# Patient Record
Sex: Male | Born: 1965 | Race: White | Hispanic: No | Marital: Married | Smoking: Never smoker
Health system: Southern US, Community
[De-identification: ages and names within clinical notes are randomized; demographics above are authoritative.]

## PROBLEM LIST (undated history)

## (undated) DIAGNOSIS — Z8619 Personal history of other infectious and parasitic diseases: Secondary | ICD-10-CM

## (undated) HISTORY — DX: Personal history of other infectious and parasitic diseases: Z86.19

## (undated) HISTORY — PX: WISDOM TOOTH EXTRACTION: SHX21

---

## 2017-05-28 ENCOUNTER — Encounter: Payer: Self-pay | Admitting: Physician Assistant

## 2017-05-28 ENCOUNTER — Ambulatory Visit (INDEPENDENT_AMBULATORY_CARE_PROVIDER_SITE_OTHER): Payer: Managed Care, Other (non HMO) | Admitting: Physician Assistant

## 2017-05-28 VITALS — BP 116/80 | HR 54 | Temp 98.2°F | Resp 14 | Ht 71.5 in | Wt 177.0 lb

## 2017-05-28 DIAGNOSIS — J208 Acute bronchitis due to other specified organisms: Secondary | ICD-10-CM

## 2017-05-28 NOTE — Progress Notes (Signed)
Patient presents to clinic today to establish care. Patient moved to the area from [place] a few months ago.  Acute Concerns: Patient c/o about 4 weeks of a cough that is mostly non-productive but is productive of yellow phelgm in the morning. Denies any prior history of asthma, RAD or chronic cough. Denies fever, chills, chest pain or SOB. Denies smoking history. Patient endorses he is very active and healthy overall.  Denies heart burn or indigestion. Does note some spring allergies with pollen. Has not required medication previously. Was recently seen at [facility] and diagnosed with viral URI w cough. Was given RX for Tessalon and Promethazine-DM. States that symptoms have been improving since onset but still present.   Health Maintenance: Immunizations -- Declines flu shot. Is unsure of last tetanus. Will get records.   Past Medical History:  Diagnosis Date  . History of chickenpox     History reviewed. No pertinent surgical history.  No current outpatient prescriptions on file prior to visit.   No current facility-administered medications on file prior to visit.     No Known Allergies  Family History  Problem Relation Age of Onset  . Healthy Mother   . Healthy Father   . Healthy Sister   . Healthy Brother   . Heart disease Maternal Grandmother 80  . Stroke Maternal Grandfather 100    Social History   Social History  . Marital status: Married    Spouse name: N/A  . Number of children: N/A  . Years of education: N/A   Occupational History  . Not on file.   Social History Main Topics  . Smoking status: Never Smoker  . Smokeless tobacco: Never Used  . Alcohol use Yes     Comment: 1-2 drinks per week  . Drug use: No  . Sexual activity: Yes   Other Topics Concern  . Not on file   Social History Narrative  . No narrative on file   Review of Systems  Constitutional: Negative for chills, fever and malaise/fatigue.  HENT: Positive for congestion. Negative for  ear discharge, hearing loss, nosebleeds and sinus pain.   Respiratory: Positive for cough and sputum production. Negative for hemoptysis and shortness of breath.   Cardiovascular: Negative for chest pain and palpitations.  Gastrointestinal: Negative for heartburn, nausea and vomiting.  Psychiatric/Behavioral: Negative for depression and suicidal ideas.   BP 116/80   Pulse (!) 54   Temp 98.2 F (36.8 C) (Oral)   Resp 14   Ht 5' 11.5" (1.816 m)   Wt 177 lb (80.3 kg)   SpO2 98%   BMI 24.34 kg/m   Physical Exam  Constitutional: He is oriented to person, place, and time and well-developed, well-nourished, and in no distress.  HENT:  Head: Normocephalic and atraumatic.  Right Ear: External ear normal.  Left Ear: External ear normal.  Nose: Nose normal.  Mouth/Throat: Oropharynx is clear and moist. No oropharyngeal exudate.  TM within normal limits bilaterally.  Eyes: Conjunctivae are normal.  Neck: Neck supple.  Cardiovascular: Normal rate, regular rhythm, normal heart sounds and intact distal pulses.   Pulmonary/Chest: Effort normal and breath sounds normal. No respiratory distress. He has no wheezes. He has no rales. He exhibits no tenderness.  Lymphadenopathy:    He has no cervical adenopathy.  Neurological: He is alert and oriented to person, place, and time.  Skin: Skin is warm and dry. No rash noted.  Psychiatric: Affect normal.  Vitals reviewed.  Assessment/Plan: 1. Viral bronchitis Improving.  Just residual dry cough. Exam unremarkable. Start daily antihistamine and saline nasal rinses. Mucinex as directed. Continue cough suppressants. Follow-up if not continuing to improve over the next week.   Leeanne Rio, PA-C

## 2017-05-28 NOTE — Progress Notes (Signed)
Pre visit review using our clinic review tool, if applicable. No additional management support is needed unless otherwise documented below in the visit note. 

## 2017-05-28 NOTE — Patient Instructions (Signed)
Please stay well hydrated and get plenty of rest.  Start a saline nasal rinse each evening and in the morning to flush out any drainage that must be occurring.   Start a daily Claritin. Eat a bland diet. Start a Mucinex (plain) twice daily. Continue cough medications.  Follow-up if symptoms are not continuing to improve.  Otherwise, schedule a complete physical when you are feeling better.

## 2017-05-31 ENCOUNTER — Ambulatory Visit: Payer: Self-pay | Admitting: Physician Assistant

## 2017-10-27 ENCOUNTER — Encounter: Payer: Self-pay | Admitting: Emergency Medicine

## 2019-01-06 ENCOUNTER — Telehealth: Payer: Self-pay | Admitting: Emergency Medicine

## 2019-01-06 NOTE — Telephone Encounter (Signed)
LMOVM advising patient to call back to schedule yearly physical and video visit for any upcoming problems or concerns.

## 2019-05-04 ENCOUNTER — Other Ambulatory Visit: Payer: Self-pay

## 2019-05-04 ENCOUNTER — Ambulatory Visit (INDEPENDENT_AMBULATORY_CARE_PROVIDER_SITE_OTHER): Payer: Managed Care, Other (non HMO) | Admitting: Physician Assistant

## 2019-05-04 ENCOUNTER — Encounter: Payer: Self-pay | Admitting: Physician Assistant

## 2019-05-04 VITALS — BP 120/80 | HR 60 | Temp 97.9°F | Resp 16 | Ht 71.0 in | Wt 162.0 lb

## 2019-05-04 DIAGNOSIS — Z0000 Encounter for general adult medical examination without abnormal findings: Secondary | ICD-10-CM | POA: Diagnosis not present

## 2019-05-04 DIAGNOSIS — Z125 Encounter for screening for malignant neoplasm of prostate: Secondary | ICD-10-CM

## 2019-05-04 DIAGNOSIS — Z23 Encounter for immunization: Secondary | ICD-10-CM

## 2019-05-04 DIAGNOSIS — Z1211 Encounter for screening for malignant neoplasm of colon: Secondary | ICD-10-CM | POA: Diagnosis not present

## 2019-05-04 DIAGNOSIS — K648 Other hemorrhoids: Secondary | ICD-10-CM

## 2019-05-04 LAB — CBC WITH DIFFERENTIAL/PLATELET
Basophils Absolute: 0.1 10*3/uL (ref 0.0–0.1)
Basophils Relative: 1.1 % (ref 0.0–3.0)
Eosinophils Absolute: 0.1 10*3/uL (ref 0.0–0.7)
Eosinophils Relative: 1.7 % (ref 0.0–5.0)
HCT: 47.8 % (ref 39.0–52.0)
Hemoglobin: 15.8 g/dL (ref 13.0–17.0)
Lymphocytes Relative: 32.6 % (ref 12.0–46.0)
Lymphs Abs: 1.5 10*3/uL (ref 0.7–4.0)
MCHC: 33.1 g/dL (ref 30.0–36.0)
MCV: 90.2 fl (ref 78.0–100.0)
Monocytes Absolute: 0.4 10*3/uL (ref 0.1–1.0)
Monocytes Relative: 7.6 % (ref 3.0–12.0)
Neutro Abs: 2.7 10*3/uL (ref 1.4–7.7)
Neutrophils Relative %: 57 % (ref 43.0–77.0)
Platelets: 229 10*3/uL (ref 150.0–400.0)
RBC: 5.3 Mil/uL (ref 4.22–5.81)
RDW: 13.3 % (ref 11.5–15.5)
WBC: 4.7 10*3/uL (ref 4.0–10.5)

## 2019-05-04 LAB — LIPID PANEL
Cholesterol: 236 mg/dL — ABNORMAL HIGH (ref 0–200)
HDL: 63 mg/dL (ref >39.00)
LDL Cholesterol: 156 mg/dL — ABNORMAL HIGH (ref 0–99)
NonHDL: 173.08
Total CHOL/HDL Ratio: 4
Triglycerides: 83 mg/dL (ref 0.0–149.0)
VLDL: 16.6 mg/dL (ref 0.0–40.0)

## 2019-05-04 LAB — COMPREHENSIVE METABOLIC PANEL
ALT: 16 U/L (ref 0–53)
AST: 18 U/L (ref 0–37)
Albumin: 4.7 g/dL (ref 3.5–5.2)
Alkaline Phosphatase: 57 U/L (ref 39–117)
BUN: 15 mg/dL (ref 6–23)
CO2: 30 mEq/L (ref 19–32)
Calcium: 9.7 mg/dL (ref 8.4–10.5)
Chloride: 103 mEq/L (ref 96–112)
Creatinine, Ser: 1.02 mg/dL (ref 0.40–1.50)
GFR: 76.93 mL/min (ref >60.00)
Glucose, Bld: 97 mg/dL (ref 70–99)
Potassium: 4.5 mEq/L (ref 3.5–5.1)
Sodium: 140 mEq/L (ref 135–145)
Total Bilirubin: 1 mg/dL (ref 0.2–1.2)
Total Protein: 7 g/dL (ref 6.0–8.3)

## 2019-05-04 LAB — HEMOGLOBIN A1C: Hgb A1c MFr Bld: 5.4 % (ref 4.6–6.5)

## 2019-05-04 LAB — PSA: PSA: 2.68 ng/mL (ref 0.10–4.00)

## 2019-05-04 NOTE — Assessment & Plan Note (Signed)
Depression screen negative. Health Maintenance reviewed. Preventive schedule discussed and handout given in AVS. Will obtain fasting labs today.  

## 2019-05-04 NOTE — Assessment & Plan Note (Signed)
TDaP updated today. 

## 2019-05-04 NOTE — Assessment & Plan Note (Signed)
Noted initially by patient when wiping. Non-painful and no bleeding. Poor fluid intake. Low fiber. He is to increase these and monitor symptoms. Return precautions reviewed with patient.

## 2019-05-04 NOTE — Assessment & Plan Note (Signed)
The natural history of prostate cancer and ongoing controversy regarding screening and potential treatment outcomes of prostate cancer has been discussed with the patient. The meaning of a false positive PSA and a false negative PSA has been discussed. He indicates understanding of the limitations of this screening test and wishes  to proceed with screening PSA testing.  

## 2019-05-04 NOTE — Progress Notes (Signed)
Patient presents to clinic today for annual exam.  Patient is fasting for labs.   Diet -- Endorses diet is mainly lean protein, fruits and starches. Low vegetable intake.  Exercise -- Runs 3-6 miles per day. Mountain biking on the weekends.   Acute Concerns: Patient endorses noting a small bump at the base of anus. First noted a couple of weeks ago when wiping. No pain or tenderness. Denies blood when wiping. Has never had this before. Notes he does not hydrate like he should. Some strain with BM but denies excess or hard stools. Denies tenesmus or melena.  Health Maintenance: Immunizations -- Declines flu shot. TDaP due. Agrees to have today..  Colon Cancer Screening -- Average risk. Agrees to colonoscopy. Order to be placed.   Past Medical History:  Diagnosis Date  . History of chickenpox     History reviewed. No pertinent surgical history.  No current outpatient medications on file prior to visit.   No current facility-administered medications on file prior to visit.     No Known Allergies  Family History  Problem Relation Age of Onset  . Healthy Mother   . Healthy Father   . Healthy Sister   . Healthy Brother   . Heart disease Maternal Grandmother 80  . Stroke Maternal Grandfather 100    Social History   Socioeconomic History  . Marital status: Married    Spouse name: Not on file  . Number of children: Not on file  . Years of education: Not on file  . Highest education level: Not on file  Occupational History  . Not on file  Social Needs  . Financial resource strain: Not on file  . Food insecurity    Worry: Not on file    Inability: Not on file  . Transportation needs    Medical: Not on file    Non-medical: Not on file  Tobacco Use  . Smoking status: Never Smoker  . Smokeless tobacco: Never Used  Substance and Sexual Activity  . Alcohol use: Yes    Comment: 1-2 drinks per week  . Drug use: No  . Sexual activity: Yes  Lifestyle  . Physical  activity    Days per week: Not on file    Minutes per session: Not on file  . Stress: Not on file  Relationships  . Social Herbalist on phone: Not on file    Gets together: Not on file    Attends religious service: Not on file    Active member of club or organization: Not on file    Attends meetings of clubs or organizations: Not on file    Relationship status: Not on file  . Intimate partner violence    Fear of current or ex partner: Not on file    Emotionally abused: Not on file    Physically abused: Not on file    Forced sexual activity: Not on file  Other Topics Concern  . Not on file  Social History Narrative  . Not on file   Review of Systems  Constitutional: Negative for fever and weight loss.  HENT: Negative for ear discharge, ear pain, hearing loss and tinnitus.   Eyes: Negative for blurred vision, double vision, photophobia and pain.  Respiratory: Negative for cough and shortness of breath.   Cardiovascular: Negative for chest pain and palpitations.  Gastrointestinal: Negative for abdominal pain, blood in stool, constipation, diarrhea, heartburn, melena, nausea and vomiting.  Genitourinary: Negative for dysuria,  flank pain, frequency, hematuria and urgency.       Nocturia x 0-1.   Musculoskeletal: Negative for falls.  Neurological: Negative for dizziness, loss of consciousness and headaches.  Endo/Heme/Allergies: Negative for environmental allergies.  Psychiatric/Behavioral: Negative for depression, hallucinations, substance abuse and suicidal ideas. The patient is not nervous/anxious and does not have insomnia.    BP 120/80   Pulse 60   Temp 97.9 F (36.6 C) (Skin)   Resp 16   Ht 5\' 11"  (1.803 m)   Wt 162 lb (73.5 kg)   SpO2 99%   BMI 22.59 kg/m   Physical Exam Vitals signs reviewed.  Constitutional:      General: He is not in acute distress.    Appearance: He is well-developed. He is not diaphoretic.  HENT:     Head: Normocephalic and  atraumatic.     Right Ear: Tympanic membrane, ear canal and external ear normal.     Left Ear: Tympanic membrane, ear canal and external ear normal.     Nose: Nose normal.     Mouth/Throat:     Pharynx: No posterior oropharyngeal erythema.  Eyes:     Conjunctiva/sclera: Conjunctivae normal.     Pupils: Pupils are equal, round, and reactive to light.  Neck:     Musculoskeletal: Neck supple.     Thyroid: No thyromegaly.  Cardiovascular:     Rate and Rhythm: Normal rate and regular rhythm.     Heart sounds: Normal heart sounds.  Pulmonary:     Effort: Pulmonary effort is normal. No respiratory distress.     Breath sounds: Normal breath sounds. No wheezing or rales.  Chest:     Chest wall: No tenderness.  Abdominal:     General: Bowel sounds are normal. There is no distension.     Palpations: Abdomen is soft. There is no mass.     Tenderness: There is no abdominal tenderness. There is no guarding or rebound.  Genitourinary:    Rectum: Guaiac result negative. Internal hemorrhoid (6 o clock position) present. No mass, tenderness, anal fissure or external hemorrhoid.  Lymphadenopathy:     Cervical: No cervical adenopathy.  Skin:    General: Skin is warm and dry.     Findings: No rash.  Neurological:     Mental Status: He is alert and oriented to person, place, and time.     Cranial Nerves: No cranial nerve deficit.    Assessment/Plan: Visit for preventive health examination Depression screen negative. Health Maintenance reviewed. Preventive schedule discussed and handout given in AVS. Will obtain fasting labs today.   Colon cancer screening Average risk. Asymptomatic. Agrees to colonoscopy. Referral to GI placed for screening colonoscopy.  Prostate cancer screening The natural history of prostate cancer and ongoing controversy regarding screening and potential treatment outcomes of prostate cancer has been discussed with the patient. The meaning of a false positive PSA and a  false negative PSA has been discussed. He indicates understanding of the limitations of this screening test and wishes to proceed with screening PSA testing.   Need for Tdap vaccination TDaP updated today.  Internal hemorrhoid Noted initially by patient when wiping. Non-painful and no bleeding. Poor fluid intake. Low fiber. He is to increase these and monitor symptoms. Return precautions reviewed with patient.     Leeanne Rio, PA-C

## 2019-05-04 NOTE — Assessment & Plan Note (Signed)
Average risk. Asymptomatic. Agrees to colonoscopy. Referral to GI placed for screening colonoscopy.

## 2019-05-04 NOTE — Patient Instructions (Signed)
Please go to the lab for blood work.   Our office will call you with your results unless you have chosen to receive results via MyChart.  If your blood work is normal we will follow-up each year for physicals and as scheduled for chronic medical problems.  If anything is abnormal we will treat accordingly and get you in for a follow-up.  Increase water intake to help with smoother bowel movements. Also recommend increase in vegetables for fiber and there Phytonutrients not in your proteins and starches! Let me know if the area discussed today is not improving or if you note any tenderness, enlargement of the area, etc.    Preventive Care 27-62 Years Old, Male Preventive care refers to lifestyle choices and visits with your health care provider that can promote health and wellness. This includes:  A yearly physical exam. This is also called an annual well check.  Regular dental and eye exams.  Immunizations.  Screening for certain conditions.  Healthy lifestyle choices, such as eating a healthy diet, getting regular exercise, not using drugs or products that contain nicotine and tobacco, and limiting alcohol use. What can I expect for my preventive care visit? Physical exam Your health care provider will check:  Height and weight. These may be used to calculate body mass index (BMI), which is a measurement that tells if you are at a healthy weight.  Heart rate and blood pressure.  Your skin for abnormal spots. Counseling Your health care provider may ask you questions about:  Alcohol, tobacco, and drug use.  Emotional well-being.  Home and relationship well-being.  Sexual activity.  Eating habits.  Work and work Statistician. What immunizations do I need?  Influenza (flu) vaccine  This is recommended every year. Tetanus, diphtheria, and pertussis (Tdap) vaccine  You may need a Td booster every 10 years. Varicella (chickenpox) vaccine  You may need this vaccine  if you have not already been vaccinated. Zoster (shingles) vaccine  You may need this after age 40. Measles, mumps, and rubella (MMR) vaccine  You may need at least one dose of MMR if you were born in 1957 or later. You may also need a second dose. Pneumococcal conjugate (PCV13) vaccine  You may need this if you have certain conditions and were not previously vaccinated. Pneumococcal polysaccharide (PPSV23) vaccine  You may need one or two doses if you smoke cigarettes or if you have certain conditions. Meningococcal conjugate (MenACWY) vaccine  You may need this if you have certain conditions. Hepatitis A vaccine  You may need this if you have certain conditions or if you travel or work in places where you may be exposed to hepatitis A. Hepatitis B vaccine  You may need this if you have certain conditions or if you travel or work in places where you may be exposed to hepatitis B. Haemophilus influenzae type b (Hib) vaccine  You may need this if you have certain risk factors. Human papillomavirus (HPV) vaccine  If recommended by your health care provider, you may need three doses over 6 months. You may receive vaccines as individual doses or as more than one vaccine together in one shot (combination vaccines). Talk with your health care provider about the risks and benefits of combination vaccines. What tests do I need? Blood tests  Lipid and cholesterol levels. These may be checked every 5 years, or more frequently if you are over 65 years old.  Hepatitis C test.  Hepatitis B test. Screening  Lung cancer  screening. You may have this screening every year starting at age 94 if you have a 30-pack-year history of smoking and currently smoke or have quit within the past 15 years.  Prostate cancer screening. Recommendations will vary depending on your family history and other risks.  Colorectal cancer screening. All adults should have this screening starting at age 51 and  continuing until age 83. Your health care provider may recommend screening at age 25 if you are at increased risk. You will have tests every 1-10 years, depending on your results and the type of screening test.  Diabetes screening. This is done by checking your blood sugar (glucose) after you have not eaten for a while (fasting). You may have this done every 1-3 years.  Sexually transmitted disease (STD) testing. Follow these instructions at home: Eating and drinking  Eat a diet that includes fresh fruits and vegetables, whole grains, lean protein, and low-fat dairy products.  Take vitamin and mineral supplements as recommended by your health care provider.  Do not drink alcohol if your health care provider tells you not to drink.  If you drink alcohol: ? Limit how much you have to 0-2 drinks a day. ? Be aware of how much alcohol is in your drink. In the [place] one drink equals one 12 oz bottle of beer (355 mL), one 5 oz glass of wine (148 mL), or one 1 oz glass of hard liquor (44 mL). Lifestyle  Take daily care of your teeth and gums.  Stay active. Exercise for at least 30 minutes on 5 or more days each week.  Do not use any products that contain nicotine or tobacco, such as cigarettes, e-cigarettes, and chewing tobacco. If you need help quitting, ask your health care provider.  If you are sexually active, practice safe sex. Use a condom or other form of protection to prevent STIs (sexually transmitted infections).  Talk with your health care provider about taking a low-dose aspirin every day starting at age 62. What's next?  Go to your health care provider once a year for a well check visit.  Ask your health care provider how often you should have your eyes and teeth checked.  Stay up to date on all vaccines. This information is not intended to replace advice given to you by your health care provider. Make sure you discuss any questions you have with your health care provider.  Document Released: 09/20/2015 Document Revised: 08/18/2018 Document Reviewed: 08/18/2018 Elsevier Patient Education  2020 [employer]. .

## 2019-05-05 ENCOUNTER — Other Ambulatory Visit: Payer: Self-pay

## 2019-05-05 DIAGNOSIS — R972 Elevated prostate specific antigen [PSA]: Secondary | ICD-10-CM

## 2019-05-16 ENCOUNTER — Encounter: Payer: Self-pay | Admitting: Physician Assistant

## 2019-05-16 ENCOUNTER — Other Ambulatory Visit: Payer: Self-pay

## 2019-05-16 ENCOUNTER — Ambulatory Visit: Payer: Managed Care, Other (non HMO) | Admitting: Physician Assistant

## 2019-05-16 VITALS — BP 120/80 | HR 62 | Temp 98.0°F | Resp 16 | Ht 71.0 in | Wt 163.0 lb

## 2019-05-16 DIAGNOSIS — R1032 Left lower quadrant pain: Secondary | ICD-10-CM | POA: Diagnosis not present

## 2019-05-16 NOTE — Patient Instructions (Signed)
Examination was good today. No palpable hernia. Likely strain in one of the deeper muscles in the region. Recommend limit running, heavy lifting or overexertion for the next week. Aleve once daily with food to help reduce inflammation. If symptoms are not improving/resolving or if anything worsens, please let me know as I would want to obtain imaging to assess for an occult hernia.

## 2019-05-16 NOTE — Progress Notes (Signed)
Patient presents to clinic today c/o intermittent mild pain noted as a pulling sensation in L inguinal region x 1 week. First noted with running. Notes hurting at onset of running but resolved after about the first mile. Is not a new runner. Notes pain sometimes when going from a sitting to standing position. Occasional radiation into the L scrotum. Denies swelling or bulging. Denies skin changes. Denies heavy lifting, trauma or injury. Denies history of herniation. Denies needing to take anything for the pain as is so mild and intermittent.   Past Medical History:  Diagnosis Date  . History of chickenpox     No current outpatient medications on file prior to visit.   No current facility-administered medications on file prior to visit.     No Known Allergies  Family History  Problem Relation Age of Onset  . Healthy Mother   . Healthy Father   . Healthy Sister   . Healthy Brother   . Heart disease Maternal Grandmother 80  . Stroke Maternal Grandfather 100    Social History   Socioeconomic History  . Marital status: Married    Spouse name: Not on file  . Number of children: Not on file  . Years of education: Not on file  . Highest education level: Not on file  Occupational History  . Not on file  Social Needs  . Financial resource strain: Not on file  . Food insecurity    Worry: Not on file    Inability: Not on file  . Transportation needs    Medical: Not on file    Non-medical: Not on file  Tobacco Use  . Smoking status: Never Smoker  . Smokeless tobacco: Never Used  Substance and Sexual Activity  . Alcohol use: Yes    Comment: 1-2 drinks per week  . Drug use: No  . Sexual activity: Yes  Lifestyle  . Physical activity    Days per week: Not on file    Minutes per session: Not on file  . Stress: Not on file  Relationships  . Social Herbalist on phone: Not on file    Gets together: Not on file    Attends religious service: Not on file    Active  member of club or organization: Not on file    Attends meetings of clubs or organizations: Not on file    Relationship status: Not on file  Other Topics Concern  . Not on file  Social History Narrative  . Not on file   Review of Systems - See HPI.  All other ROS are negative.  Ht 5\' 11"  (1.803 m)   Wt 163 lb (73.9 kg)   BMI 22.73 kg/m   Physical Exam Constitutional:      Appearance: Normal appearance. He is not ill-appearing.  HENT:     Head: Normocephalic and atraumatic.  Abdominal:     General: Bowel sounds are normal. There is no distension.     Palpations: Abdomen is soft. There is no mass.     Tenderness: There is no abdominal tenderness. There is no guarding or rebound.     Hernia: No hernia is present.  Musculoskeletal:     Right hip: Normal.     Left hip: He exhibits tenderness (some L inguinal tenderness with leg raise. No bulging noted during this. Not reproduced by "curunch"). He exhibits normal range of motion and normal strength.  Neurological:     Mental Status: He is alert.  Recent Results (from the past 2160 hour(s))  CBC with Differential/Platelet     Status: None   Collection Time: 05/04/19  9:59 AM  Result Value Ref Range   WBC 4.7 4.0 - 10.5 K/uL   RBC 5.30 4.22 - 5.81 Mil/uL   Hemoglobin 15.8 13.0 - 17.0 g/dL   HCT 47.8 39.0 - 52.0 %   MCV 90.2 78.0 - 100.0 fl   MCHC 33.1 30.0 - 36.0 g/dL   RDW 13.3 11.5 - 15.5 %   Platelets 229.0 150.0 - 400.0 K/uL   Neutrophils Relative % 57.0 43.0 - 77.0 %   Lymphocytes Relative 32.6 12.0 - 46.0 %   Monocytes Relative 7.6 3.0 - 12.0 %   Eosinophils Relative 1.7 0.0 - 5.0 %   Basophils Relative 1.1 0.0 - 3.0 %   Neutro Abs 2.7 1.4 - 7.7 K/uL   Lymphs Abs 1.5 0.7 - 4.0 K/uL   Monocytes Absolute 0.4 0.1 - 1.0 K/uL   Eosinophils Absolute 0.1 0.0 - 0.7 K/uL   Basophils Absolute 0.1 0.0 - 0.1 K/uL  Comprehensive metabolic panel     Status: None   Collection Time: 05/04/19  9:59 AM  Result Value Ref Range    Sodium 140 135 - 145 mEq/L   Potassium 4.5 3.5 - 5.1 mEq/L   Chloride 103 96 - 112 mEq/L   CO2 30 19 - 32 mEq/L   Glucose, Bld 97 70 - 99 mg/dL   BUN 15 6 - 23 mg/dL   Creatinine, Ser 1.02 0.40 - 1.50 mg/dL   Total Bilirubin 1.0 0.2 - 1.2 mg/dL   Alkaline Phosphatase 57 39 - 117 U/L   AST 18 0 - 37 U/L   ALT 16 0 - 53 U/L   Total Protein 7.0 6.0 - 8.3 g/dL   Albumin 4.7 3.5 - 5.2 g/dL   Calcium 9.7 8.4 - 10.5 mg/dL   GFR 76.93 >60.00 mL/min  Hemoglobin A1c     Status: None   Collection Time: 05/04/19  9:59 AM  Result Value Ref Range   Hgb A1c MFr Bld 5.4 4.6 - 6.5 %    Comment: Glycemic Control Guidelines for People with Diabetes:Non Diabetic:  <6%Goal of Therapy: <7%Additional Action Suggested:  >8%   Lipid panel     Status: Abnormal   Collection Time: 05/04/19  9:59 AM  Result Value Ref Range   Cholesterol 236 (H) 0 - 200 mg/dL    Comment: ATP III Classification       Desirable:  < 200 mg/dL               Borderline High:  200 - 239 mg/dL          High:  > = 240 mg/dL   Triglycerides 83.0 0.0 - 149.0 mg/dL    Comment: Normal:  <150 mg/dLBorderline High:  150 - 199 mg/dL   HDL 63.00 >39.00 mg/dL   VLDL 16.6 0.0 - 40.0 mg/dL   LDL Cholesterol 156 (H) 0 - 99 mg/dL   Total CHOL/HDL Ratio 4     Comment:                Men          Women1/2 Average Risk     3.4          3.3Average Risk          5.0          4.42X Average Risk  9.6          7.13X Average Risk          15.0          11.0                       NonHDL 173.08     Comment: NOTE:  Non-HDL goal should be 30 mg/dL higher than patient's LDL goal (i.e. LDL goal of < 70 mg/dL, would have non-HDL goal of < 100 mg/dL)  PSA     Status: None   Collection Time: 05/04/19  9:59 AM  Result Value Ref Range   PSA 2.68 0.10 - 4.00 ng/mL    Comment: Test performed using Access [employer]br[employer]ch PSA Assay, a parmagnetic partical, chemiluminecent immunoassay.   Assessment/Plan: 1. Left inguinal pain Examination unremarkable overall  except for mild pain with elevation of his L leg. No hernia noted on examination. Recommend Rest and Ibuprofen over the next week to calm down any inflammation. Stretch well before running. Ease back into running. Avoid heavy lifting or overexertion. If not resolving or if anything worsens, will obtain imaging.     Leeanne Rio, PA-C

## 2019-06-07 ENCOUNTER — Encounter: Payer: Self-pay | Admitting: Gastroenterology

## 2019-06-19 ENCOUNTER — Encounter: Payer: Self-pay | Admitting: Gastroenterology

## 2019-06-19 ENCOUNTER — Other Ambulatory Visit: Payer: Self-pay

## 2019-06-19 ENCOUNTER — Ambulatory Visit (AMBULATORY_SURGERY_CENTER): Payer: Self-pay | Admitting: *Deleted

## 2019-06-19 VITALS — Ht 72.0 in | Wt 169.2 lb

## 2019-06-19 DIAGNOSIS — Z1211 Encounter for screening for malignant neoplasm of colon: Secondary | ICD-10-CM

## 2019-06-19 MED ORDER — SUPREP BOWEL PREP KIT 17.5-3.13-1.6 GM/177ML PO SOLN: 1.0000 | Freq: Once | ORAL | 0 refills | Status: AC

## 2019-06-19 NOTE — Progress Notes (Signed)
Patient denies any allergies to egg or soy products. Patient has never had any surgeries or been sedated.  Patient denies oxygen use at home and denies diet medications. Emmi instructions for colonoscopy/endoscopy explained and given to patient. Suprep coupon given.

## 2019-06-29 ENCOUNTER — Encounter: Payer: Self-pay | Admitting: Gastroenterology

## 2019-06-29 ENCOUNTER — Ambulatory Visit (AMBULATORY_SURGERY_CENTER): Payer: Managed Care, Other (non HMO) | Admitting: Gastroenterology

## 2019-06-29 ENCOUNTER — Other Ambulatory Visit: Payer: Self-pay

## 2019-06-29 VITALS — BP 119/78 | HR 64 | Temp 98.0°F | Resp 11 | Ht 72.0 in | Wt 169.0 lb

## 2019-06-29 DIAGNOSIS — D122 Benign neoplasm of ascending colon: Secondary | ICD-10-CM | POA: Diagnosis not present

## 2019-06-29 DIAGNOSIS — Z1211 Encounter for screening for malignant neoplasm of colon: Secondary | ICD-10-CM | POA: Diagnosis present

## 2019-06-29 MED ORDER — SODIUM CHLORIDE 0.9 % IV SOLN: 500.0000 mL | Freq: Once | INTRAVENOUS | Status: DC

## 2019-06-29 NOTE — Progress Notes (Signed)
Called to room to assist during endoscopic procedure.  Patient ID and intended procedure confirmed with present staff. Received instructions for my participation in the procedure from the performing physician.  

## 2019-06-29 NOTE — Progress Notes (Signed)
Report given to PACU, vss 

## 2019-06-29 NOTE — Op Note (Signed)
[facility] Patient Name: Kenneth Irwin Procedure Date: 06/29/2019 1:59 PM MRN: [id number] Endoscopist: Mallie Mussel L. Loletha Carrow , MD Age: 53 Referring MD:  Date of Birth: 05-09-1966 Gender: Male Account #: [id number] Procedure:                Colonoscopy Indications:              Screening for colorectal malignant neoplasm, This                            is the patient's first colonoscopy Medicines:                Monitored Anesthesia Care Procedure:                Pre-Anesthesia Assessment:                           - Prior to the procedure, a History and Physical                            was performed, and patient medications and                            allergies were reviewed. The patient's tolerance of                            previous anesthesia was also reviewed. The risks                            and benefits of the procedure and the sedation                            options and risks were discussed with the patient.                            All questions were answered, and informed consent                            was obtained. Prior Anticoagulants: The patient has                            taken no previous anticoagulant or antiplatelet                            agents. ASA Grade Assessment: I - A normal, healthy                            patient. After reviewing the risks and benefits,                            the patient was deemed in satisfactory condition to                            undergo the procedure.  After obtaining informed consent, the colonoscope                            was passed under direct vision. Throughout the                            procedure, the patient's blood pressure, pulse, and                            oxygen saturations were monitored continuously. The                            Colonoscope was introduced through the anus and                            advanced to the the cecum, identified by                            appendiceal orifice and ileocecal valve. The                            colonoscopy was performed without difficulty. The                            patient tolerated the procedure well. The quality                            of the bowel preparation was excellent. The                            ileocecal valve, appendiceal orifice, and rectum                            were photographed. Scope In: 2:06:13 PM Scope Out: 2:20:12 PM Scope Withdrawal Time: 0 hours 9 minutes 30 seconds  Total Procedure Duration: 0 hours 13 minutes 59 seconds  Findings:                 The perianal and digital rectal examinations were                            normal.                           A diminutive polyp was found in the ascending                            colon. The polyp was sessile. The polyp was removed                            with a cold snare. Resection and retrieval were                            complete.  The exam was otherwise without abnormality on                            direct and retroflexion views. Complications:            No immediate complications. Estimated Blood Loss:     Estimated blood loss was minimal. Impression:               - One diminutive polyp in the ascending colon,                            removed with a cold snare. Resected and retrieved.                           - The examination was otherwise normal on direct                            and retroflexion views. Recommendation:           - Patient has a contact number available for                            emergencies. The signs and symptoms of potential                            delayed complications were discussed with the                            patient. Return to normal activities tomorrow.                            Written discharge instructions were provided to the                            patient.                           - Resume previous  diet.                           - Continue present medications.                           - Await pathology results.                           - Repeat colonoscopy is recommended for                            surveillance. The colonoscopy date will be                            determined after pathology results from today's                            exam become available for review. Henry L. Loletha Carrow, MD 06/29/2019 2:28:07 PM This report has been  signed electronically.

## 2019-06-29 NOTE — Patient Instructions (Signed)
HANDOUTS PROVIDED ON:  POLYPS  THE POLYP REMOVED HAS BEEN SENT TO PATHOLOGY.  THE RESULTS WILL TAKE 2-3 WEEKS TO RECEIVE.  YOUR NEXT COLONOSCOPY WILL BE SCHEDULED BASED ON THE PATHOLOGY RESULTS.  YOU MAY RESUME YOUR PREVIOUS DIET AND MEDICATION SCHEDULE.  [place] YOU FOR ALLOWING [place] TO CARE FOR YOU TODAY!!  YOU HAD AN ENDOSCOPIC PROCEDURE TODAY AT [facility]:   Refer to the procedure report that was given to you for any specific questions about what was found during the examination.  If the procedure report does not answer your questions, please call your gastroenterologist to clarify.  If you requested that your care partner not be given the details of your procedure findings, then the procedure report has been included in a sealed envelope for you to review at your convenience later.  YOU SHOULD EXPECT: Some feelings of bloating in the abdomen. Passage of more gas than usual.  Walking can help get rid of the air that was put into your GI tract during the procedure and reduce the bloating. If you had a lower endoscopy (such as a colonoscopy or flexible sigmoidoscopy) you may notice spotting of blood in your stool or on the toilet paper. If you underwent a bowel prep for your procedure, you may not have a normal bowel movement for a few days.  Please Note:  You might notice some irritation and congestion in your nose or some drainage.  This is from the oxygen used during your procedure.  There is no need for concern and it should clear up in a day or so.  SYMPTOMS TO REPORT IMMEDIATELY:   Following lower endoscopy (colonoscopy or flexible sigmoidoscopy):  Excessive amounts of blood in the stool  Significant tenderness or worsening of abdominal pains  Swelling of the abdomen that is new, acute  Fever of 100F or higher  For urgent or emergent issues, a gastroenterologist can be reached at any hour by calling [phone].   DIET:  We do recommend a small meal at first, but  then you may proceed to your regular diet.  Drink plenty of fluids but you should avoid alcoholic beverages for 24 hours.  ACTIVITY:  You should plan to take it easy for the rest of today and you should NOT DRIVE or use heavy machinery until tomorrow (because of the sedation medicines used during the test).    FOLLOW UP: Our staff will call the number listed on your records 48-72 hours following your procedure to check on you and address any questions or concerns that you may have regarding the information given to you following your procedure. If we do not reach you, we will leave a message.  We will attempt to reach you two times.  During this call, we will ask if you have developed any symptoms of COVID 19. If you develop any symptoms (ie: fever, flu-like symptoms, shortness of breath, cough etc.) before then, please call [phone].  If you test positive for Covid 19 in the 2 weeks post procedure, please call and report this information to [place].    If any biopsies were taken you will be contacted by phone or by letter within the next 1-3 weeks.  Please call us at [phone] if you have not heard about the biopsies in 3 weeks.    SIGNATURES/CONFIDENTIALITY: You and/or your care partner have signed paperwork which will be entered into your electronic medical record.  These signatures attest to the fact that that the  information above on your After Visit Summary has been reviewed and is understood.  Full responsibility of the confidentiality of this discharge information lies with you and/or your care-partner.

## 2019-07-03 ENCOUNTER — Telehealth: Payer: Self-pay

## 2019-07-03 NOTE — Telephone Encounter (Signed)
Follow up call attempted.  NALM  

## 2019-07-06 ENCOUNTER — Encounter: Payer: Self-pay | Admitting: Gastroenterology

## 2019-08-07 ENCOUNTER — Ambulatory Visit: Payer: Managed Care, Other (non HMO)

## 2019-09-13 ENCOUNTER — Encounter: Payer: Self-pay | Admitting: Physician Assistant

## 2019-09-13 ENCOUNTER — Ambulatory Visit (INDEPENDENT_AMBULATORY_CARE_PROVIDER_SITE_OTHER): Payer: Managed Care, Other (non HMO) | Admitting: Physician Assistant

## 2019-09-13 ENCOUNTER — Other Ambulatory Visit: Payer: Self-pay

## 2019-09-13 DIAGNOSIS — I8392 Asymptomatic varicose veins of left lower extremity: Secondary | ICD-10-CM

## 2019-09-13 NOTE — Progress Notes (Signed)
I have discussed the procedure for the virtual visit with the patient who has given consent to proceed with assessment and treatment.   Ferdie Bakken S Journey Ratterman, CMA     

## 2019-09-13 NOTE — Progress Notes (Signed)
   Virtual Visit via Video   I connected with patient on 09/13/19 at  8:00 AM EST by a video enabled telemedicine application and verified that I am speaking with the correct person using two identifiers.  Location patient: Home Location provider: [facility] Persons participating in the virtual visit: Patient, Provider, [place] (Patina Moore)  I discussed the limitations of evaluation and management by telemedicine and the availability of in person appointments. The patient expressed understanding and agreed to proceed.  Subjective:   HPI:   Patient presents via doxy.me today c/o enlarged vein of left posterior lateral leg, starting just above the level of the knee and moving distal.  Notes vein looks knotted and curving in nature.  Denies overlying redness, tenderness or warmth of skin.  Denies pain or tenderness with palpation of the area.  Notes the vein collapses with pressure and rolls under his touch.  Denies any recent trauma or injury to the area.  Denies any swelling of the leg.  Unsure of how long this is been present but first noted yesterday.  No recent surgery or prolonged immobilization.  Denies prior history of clot.  Is able to ambulate without any pain or difficulty.  ROS:   See pertinent positives and negatives per HPI.  Patient Active Problem List   Diagnosis Date Noted  . Visit for preventive health examination 05/04/2019  . Colon cancer screening 05/04/2019  . Prostate cancer screening 05/04/2019  . Need for Tdap vaccination 05/04/2019  . Internal hemorrhoid 05/04/2019    Social History   Tobacco Use  . Smoking status: Never Smoker  . Smokeless tobacco: Never Used  Substance Use Topics  . Alcohol use: Yes    Comment: 1-2 drinks per week   No current outpatient medications on file.  No Known Allergies  Objective:   There were no vitals taken for this visit.  Patient is well-developed, well-nourished in no acute distress.  Resting  comfortably at home.  Head is normocephalic, atraumatic.  No labored breathing.  Speech is clear and coherent with logical content.  Patient is alert and oriented at baseline.  On video exam varicose veins noted of left leg, most prominent of posterior distal popliteal region and laterally.  No skin erythema noted.  No leg swelling noted on examination.  Assessment and Plan:   1. Asymptomatic varicose veins of left lower extremity No evidence of leg swelling.  Veins compressible with pressure and palpation by patient.  No tenderness with palpation, further lowering any concern for superficial thrombus.  No evidence of phlebitis on visual exam.  Suspect patient with mild venous insufficiency causing serpiginous varicose vein.  Discussed etiology and pathophysiology of varicose veins.  Discussed these can potentially become symptomatic can cause ulceration.  We will have him elevate legs while resting.  Consider compression stockings.  He is to contact us for any new or worsening symptoms that would prompt need for ultrasonography and/or referral to vascular surgery.  Patient voiced understanding and agreement with the plan.    Leeanne Rio, PA-C 09/13/2019

## 2019-11-06 ENCOUNTER — Ambulatory Visit: Payer: Managed Care, Other (non HMO) | Admitting: Physician Assistant

## 2019-11-06 DIAGNOSIS — Z0289 Encounter for other administrative examinations: Secondary | ICD-10-CM

## 2020-01-12 ENCOUNTER — Encounter: Payer: Self-pay | Admitting: Physician Assistant

## 2020-01-12 ENCOUNTER — Ambulatory Visit: Payer: Managed Care, Other (non HMO) | Admitting: Physician Assistant

## 2020-01-12 ENCOUNTER — Other Ambulatory Visit: Payer: Self-pay

## 2020-01-12 VITALS — BP 118/64 | HR 66 | Temp 98.7°F | Resp 16 | Wt 168.8 lb

## 2020-01-12 DIAGNOSIS — E785 Hyperlipidemia, unspecified: Secondary | ICD-10-CM

## 2020-01-12 DIAGNOSIS — Z125 Encounter for screening for malignant neoplasm of prostate: Secondary | ICD-10-CM | POA: Diagnosis not present

## 2020-01-12 DIAGNOSIS — G8929 Other chronic pain: Secondary | ICD-10-CM

## 2020-01-12 DIAGNOSIS — R1032 Left lower quadrant pain: Secondary | ICD-10-CM

## 2020-01-12 NOTE — Patient Instructions (Signed)
Please limit running for now until we get the evaluation with sports medicine.   I have placed the referral for you. You should hear from them by early next week. If not, please let us know.   Please schedule an appointment for fasting labs so we can recheck cholesterol and your PSA levels. The ladies up front will help with this.    Fat and Cholesterol Restricted Eating Plan Getting too much fat and cholesterol in your diet may cause health problems. Choosing the right foods helps keep your fat and cholesterol at normal levels. This can keep you from getting certain diseases. Your doctor may recommend an eating plan that includes:  Total fat: ______% or less of total calories a day.  Saturated fat: ______% or less of total calories a day.  Cholesterol: less than _________mg a day.  Fiber: ______g a day. What are tips for following this plan? Meal planning  At meals, divide your plate into four equal parts: ? Fill one-half of your plate with vegetables and green salads. ? Fill one-fourth of your plate with whole grains. ? Fill one-fourth of your plate with low-fat (lean) protein foods.  Eat fish that is high in omega-3 fats at least two times a week. This includes mackerel, tuna, sardines, and salmon.  Eat foods that are high in fiber, such as whole grains, beans, apples, broccoli, carrots, peas, and barley. General tips   Work with your doctor to lose weight if you need to.  Avoid: ? Foods with added sugar. ? Fried foods. ? Foods with partially hydrogenated oils.  Limit alcohol intake to no more than 1 drink a day for nonpregnant women and 2 drinks a day for men. One drink equals 12 oz of beer, 5 oz of wine, or 1 oz of hard liquor. Reading food labels  Check food labels for: ? Trans fats. ? Partially hydrogenated oils. ? Saturated fat (g) in each serving. ? Cholesterol (mg) in each serving. ? Fiber (g) in each serving.  Choose foods with healthy fats, such as: ?  Monounsaturated fats. ? Polyunsaturated fats. ? Omega-3 fats.  Choose grain products that have whole grains. Look for the word "whole" as the first word in the ingredient list. Cooking  Cook foods using low-fat methods. These include baking, boiling, grilling, and broiling.  Eat more home-cooked foods. Eat at restaurants and buffets less often.  Avoid cooking using saturated fats, such as butter, cream, palm oil, palm kernel oil, and coconut oil. Recommended foods  Fruits  All fresh, canned (in natural juice), or frozen fruits. Vegetables  Fresh or frozen vegetables (raw, steamed, roasted, or grilled). Green salads. Grains  Whole grains, such as whole wheat or whole grain breads, crackers, cereals, and pasta. Unsweetened oatmeal, bulgur, barley, quinoa, or brown rice. Corn or whole wheat flour tortillas. Meats and other protein foods  Ground beef (85% or leaner), grass-fed beef, or beef trimmed of fat. Skinless chicken or [place]. Ground chicken or [place]. Pork trimmed of fat. All fish and seafood. Egg whites. Dried beans, peas, or lentils. Unsalted nuts or seeds. Unsalted canned beans. Nut butters without added sugar or oil. Dairy  Low-fat or nonfat dairy products, such as skim or 1% milk, 2% or reduced-fat cheeses, low-fat and fat-free ricotta or cottage cheese, or plain low-fat and nonfat yogurt. Fats and oils  Tub margarine without trans fats. Light or reduced-fat mayonnaise and salad dressings. Avocado. Olive, canola, sesame, or safflower oils. The items listed above may not be a  complete list of foods and beverages you can eat. Contact a dietitian for more information. Foods to avoid Fruits  Canned fruit in heavy syrup. Fruit in cream or butter sauce. Fried fruit. Vegetables  Vegetables cooked in cheese, cream, or butter sauce. Fried vegetables. Grains  White bread. White pasta. White rice. Cornbread. Bagels, pastries, and croissants. Crackers and snack foods that  contain trans fat and hydrogenated oils. Meats and other protein foods  Fatty cuts of meat. Ribs, chicken wings, bacon, sausage, bologna, salami, chitterlings, fatback, hot dogs, bratwurst, and packaged lunch meats. Liver and organ meats. Whole eggs and egg yolks. Chicken and [place] with skin. Fried meat. Dairy  Whole or 2% milk, cream, half-and-half, and cream cheese. Whole milk cheeses. Whole-fat or sweetened yogurt. Full-fat cheeses. Nondairy creamers and whipped toppings. Processed cheese, cheese spreads, and cheese curds. Beverages  Alcohol. Sugar-sweetened drinks such as sodas, lemonade, and fruit drinks. Fats and oils  Butter, stick margarine, lard, shortening, ghee, or bacon fat. Coconut, palm kernel, and palm oils. Sweets and desserts  Corn syrup, sugars, honey, and molasses. Candy. Jam and jelly. Syrup. Sweetened cereals. Cookies, pies, cakes, donuts, muffins, and ice cream. The items listed above may not be a complete list of foods and beverages you should avoid. Contact a dietitian for more information. Summary  Choosing the right foods helps keep your fat and cholesterol at normal levels. This can keep you from getting certain diseases.  At meals, fill one-half of your plate with vegetables and green salads.  Eat high-fiber foods, like whole grains, beans, apples, carrots, peas, and barley.  Limit added sugar, saturated fats, alcohol, and fried foods. This information is not intended to replace advice given to you by your health care provider. Make sure you discuss any questions you have with your health care provider. Document Revised: 04/27/2018 Document Reviewed: 05/11/2017 Elsevier Patient Education  [place].

## 2020-01-12 NOTE — Progress Notes (Signed)
Patient presents to clinic today c/o recurrence of pain in left inguinal region.  Patient was seen for this issue September of last year with unremarkable examination.  Patient states he had eased up on running with complete resolution of symptoms.  Has refrained from running, focusing on other exercises over the past several months.  Recently he tried to get back into running and has noted recurrence of his symptoms.  Again denies any bulge or mass in the area.  Pain always going from groin to hip region.  Some residual soreness after running.  Denies any testicular pain or swelling.   Patient also due for repeat check of cholesterol due to hyperlipidemia noted on last fasting lab work.  Patient notes since that time he has changed his regimen a bit, trying to get a bit more exercise in but this has been limited (as noted above).  Has changed his diet and trying to limit red meat, focusing more on lean protein including chicken and fish.  Has removed meat from his breakfasts.  Patient also due for repeat PSA level.  At last physical PSA was within normal range but in the upper twos.  No comparison on file.  Recommended repeat check before next physical to make sure that there is no increased PSA velocity that would indicate an issue.  Denies any urinary hesitancy, dribbling or nocturia.    Past Medical History:  Diagnosis Date  . History of chickenpox     No current outpatient medications on file prior to visit.   No current facility-administered medications on file prior to visit.    No Known Allergies  Family History  Problem Relation Age of Onset  . Healthy Mother   . Cancer Mother   . Healthy Father   . Healthy Sister   . Healthy Brother   . Heart disease Maternal Grandmother 80  . Stroke Maternal Grandfather 100  . Colon cancer Neg Hx   . Rectal cancer Neg Hx   . Stomach cancer Neg Hx     Social History   Socioeconomic History  . Marital status: Married    Spouse name:  Not on file  . Number of children: Not on file  . Years of education: Not on file  . Highest education level: Not on file  Occupational History  . Not on file  Tobacco Use  . Smoking status: Never Smoker  . Smokeless tobacco: Never Used  Substance and Sexual Activity  . Alcohol use: Yes    Comment: 1-2 drinks per week  . Drug use: No  . Sexual activity: Yes  Other Topics Concern  . Not on file  Social History Narrative  . Not on file   Social Determinants of Health   Financial Resource Strain:   . Difficulty of Paying Living Expenses:   Food Insecurity:   . Worried About Charity fundraiser in the Last Year:   . Arboriculturist in the Last Year:   Transportation Needs:   . Film/video editor (Medical):   Marland Kitchen Lack of Transportation (Non-Medical):   Physical Activity:   . Days of Exercise per Week:   . Minutes of Exercise per Session:   Stress:   . Feeling of Stress :   Social Connections:   . Frequency of Communication with Friends and Family:   . Frequency of Social Gatherings with Friends and Family:   . Attends Religious Services:   . Active Member of Clubs or Organizations:   .  Attends Archivist Meetings:   Marland Kitchen Marital Status:    Review of Systems - See HPI.  All other ROS are negative.  Wt 168 lb 12.8 oz (76.6 kg)   BMI 22.89 kg/m   Physical Exam Vitals reviewed.  Constitutional:      Appearance: He is well-developed.  HENT:     Head: Normocephalic and atraumatic.  Abdominal:     General: Bowel sounds are normal. There is no distension.     Palpations: Abdomen is soft.     Tenderness: There is no abdominal tenderness.     Hernia: No hernia is present. There is no hernia in the ventral area, left inguinal area or left femoral area.  Neurological:     Mental Status: He is alert.    Assessment/Plan: 1. Groin pain, chronic, left Again examination unremarkable.  Will refer to sports medicine for further evaluation.  May benefit from physical  therapy.  Encouraged him to cut back on running presently until he gets his further evaluation. - Ambulatory referral to Sports Medicine  2. Prostate cancer screening The natural history of prostate cancer and ongoing controversy regarding screening and potential treatment outcomes of prostate cancer has been discussed with the patient. The meaning of a false positive PSA and a false negative PSA has been discussed. He indicates understanding of the limitations of this screening test and wishes to proceed with screening PSA testing.  - PSA; Future  3. Elevated fasting lipid profile Patient nonfasting today.  Order placed for him to return for fasting lipid panel.  Dietary and exercise regimen again reviewed with patient. - Lipid Profile; Future  This visit occurred during the SARS-CoV-2 public health emergency.  Safety protocols were in place, including screening questions prior to the visit, additional usage of staff PPE, and extensive cleaning of exam room while observing appropriate contact time as indicated for disinfecting solutions.     Leeanne Rio, PA-C

## 2020-01-15 ENCOUNTER — Other Ambulatory Visit: Payer: Self-pay

## 2020-01-15 ENCOUNTER — Other Ambulatory Visit (INDEPENDENT_AMBULATORY_CARE_PROVIDER_SITE_OTHER): Payer: Managed Care, Other (non HMO)

## 2020-01-15 DIAGNOSIS — E785 Hyperlipidemia, unspecified: Secondary | ICD-10-CM

## 2020-01-15 DIAGNOSIS — Z125 Encounter for screening for malignant neoplasm of prostate: Secondary | ICD-10-CM | POA: Diagnosis not present

## 2020-01-15 LAB — LIPID PANEL
Cholesterol: 214 mg/dL — ABNORMAL HIGH (ref 0–200)
HDL: 56.6 mg/dL (ref >39.00)
LDL Cholesterol: 147 mg/dL — ABNORMAL HIGH (ref 0–99)
NonHDL: 157.15
Total CHOL/HDL Ratio: 4
Triglycerides: 50 mg/dL (ref 0.0–149.0)
VLDL: 10 mg/dL (ref 0.0–40.0)

## 2020-01-15 LAB — PSA: PSA: 2.3 ng/mL (ref 0.10–4.00)

## 2020-02-01 ENCOUNTER — Ambulatory Visit (INDEPENDENT_AMBULATORY_CARE_PROVIDER_SITE_OTHER): Payer: Managed Care, Other (non HMO)

## 2020-02-01 ENCOUNTER — Other Ambulatory Visit: Payer: Self-pay

## 2020-02-01 ENCOUNTER — Ambulatory Visit: Payer: Managed Care, Other (non HMO) | Admitting: Family Medicine

## 2020-02-01 ENCOUNTER — Encounter: Payer: Self-pay | Admitting: Family Medicine

## 2020-02-01 DIAGNOSIS — R1032 Left lower quadrant pain: Secondary | ICD-10-CM | POA: Diagnosis not present

## 2020-02-01 DIAGNOSIS — M545 Low back pain, unspecified: Secondary | ICD-10-CM

## 2020-02-01 NOTE — Patient Instructions (Addendum)
Thank you for coming in today. Plan for PT.  I think at this time running is ok if you are not limping or not worsening a lot.  Next step if not better is likely MRI arthrogram of the hip.   Keep me updated.  6 weeks is generally the time for conservative management.

## 2020-02-01 NOTE — Progress Notes (Signed)
Subjective:    I'm seeing this patient as a consultation for:  Raiford Noble, PA-C. Note will be routed back to referring provider/PCP.  CC: L groin pain  I, Molly Weber, LAT, ATC, am serving as scribe for Dr. Lynne Leader.  HPI: Pt is a 54 y/o male presenting w/ c/o chronic L groin pain.  He initially reported these symptoms in September 2020 and saw his PCP.  Symptoms bothered him w/ running so he stopped running for a period of time and his symptoms resolved.  He then tried to resume running and symptoms returned.  He locates his pain to left anterior groin.  Pain occurs pretty quickly when starting running and lasts as mild soreness for a few days after he runs.  He denies any radiating pain down his leg weakness or numbness distally.  He does note a little bit of left-sided low back pain occurring over the last few weeks as well.   Aggravating factors: running Treatments tried: Rest  Past medical history, Surgical history, Family history, Social history, Allergies, and medications have been entered into the medical record, reviewed.   Review of Systems: No new headache, visual changes, nausea, vomiting, diarrhea, constipation, dizziness, abdominal pain, skin rash, fevers, chills, night sweats, weight loss, swollen lymph nodes, body aches, joint swelling, muscle aches, chest pain, shortness of breath, mood changes, visual or auditory hallucinations.   Objective:    General: Well Developed, well nourished, and in no acute distress.  Neuro/Psych: Alert and oriented x3, extra-ocular muscles intact, able to move all 4 extremities, sensation grossly intact. Skin: Warm and dry, no rashes noted.  Respiratory: Not using accessory muscles, speaking in full sentences, trachea midline.  Cardiovascular: Pulses palpable, no extremity edema. Abdomen: Does not appear distended.  Nontender.  No rebound or guarding.  No hernia present genitals bilaterally.  Testicles descended bilaterally.   Nontender. MSK: Left hip normal-appearing normal motion nontender normal strength to flexion internal rotation adduction and external rotation.  Slightly reduced to abduction 4+/5. Unable to reproduce pain in groin with any hip strength or mobility test.  Lab and Radiology Results X-ray images left hip obtained today personally and independently reviewed No significant degenerative changes obvious stress fractures in the femoral neck present. Await formal radiology review  Impression and Recommendations:    Assessment and Plan: 54 y.o. male with left hip/groin pain.  Occurs with running.  Initially concerning for femoral neck stress fracture however I do not see evidence of that on x-ray today.  However radiology overread is still pending.  Plan for trial of physical therapy.  He lives in [facility] is a good option.  Not improving let me know in about 6 weeks.  Would consider MRI arthrogram as next step.  Could potentially recheck in clinic and proceed with ultrasound examination.Marland Kitchen  PDMP not reviewed this encounter. Orders Placed This Encounter  Procedures  . DG HIP UNILAT WITH PELVIS 2-3 VIEWS LEFT    Standing Status:   Future    Number of Occurrences:   1    Standing Expiration Date:   01/31/2021    Order Specific Question:   Reason for Exam (SYMPTOM  OR DIAGNOSIS REQUIRED)    Answer:   left groin pain.    Order Specific Question:   Preferred imaging location?    Answer:   [facility]    Order Specific Question:   Radiology Contrast Protocol - do NOT remove file path    Answer:   \\  [email or web address]  . Ambulatory referral to Physical Therapy    Referral Priority:   Routine    Referral Type:   Physical Medicine    Referral Reason:   Specialty Services Required    Requested Specialty:   Physical Therapy    Number of Visits Requested:   1   No orders of the defined types were placed in this encounter.   Discussed warning  signs or symptoms. Please see discharge instructions. Patient expresses understanding.   The above documentation has been reviewed and is accurate and complete Lynne Leader, M.D.

## 2020-02-01 NOTE — Progress Notes (Signed)
Left hip is normal-appearing per radiology.  Stress fracture quite unlikely however if not better would proceed with MRI.

## 2020-07-12 NOTE — Progress Notes (Signed)
I, Wendy Poet, LAT, ATC, am serving as scribe for Dr. Lynne Leader.  Kenneth Irwin is a 54 y.o. male who presents to [place] at [facility] today for f/u L ant groin pn that is aggravated by running. Pt was last seen by Dr. Georgina Snell on 02/01/20 and was advised to begin PT and consider MRI arthrogram as next step. Since his last visit, pt reports that his L hip pain remains the same.  He completed 5 PT sessions at [facility] and has been discharged.  He has not been running for the last 5-6 weeks and notes no difference in his symptoms whether he runs or not.  PT was a bit concerned about the possibility of a sports hernia.   Dx imaging: L hip XR- 02/01/20  Pertinent review of systems: No fevers or chills  Relevant historical information: Healthy otherwise.   Exam:  BP (!) 150/100 (BP Location: Right Arm, Patient Position: Sitting, Cuff Size: Normal)   Pulse 93   Ht 6' (1.829 m)   Wt 168 lb 12.8 oz (76.6 kg)   SpO2 97%   BMI 22.89 kg/m  General: Well Developed, well nourished, and in no acute distress.   MSK: L-spine nontender midline normal lumbar motion. Left hip normal-appearing nontender normal hip motion.  Normal strength.    Lab and Radiology Results X-ray images L-spine obtained today personally and independently interpreted No significant or severe degenerative changes.  No acute fractures or malalignment. Await formal radiology review    Assessment and Plan: 54 y.o. male with left groin pain ongoing for months.  Failing typical conservative management occluding physical therapy.  At this point differential diagnosis includes intra-articular cause of hip pain such as focal chondromalacia, labrum tear, avascular necrosis, or even femoral neck stress fracture.  This is less likely given normal hip x-ray in May.  Differential also includes L1 lumbar radiculopathy although again less likely given relatively normal lumbar spine x-ray.  Differential includes  peripheral neuropathy or even sports hernia. Plan to start further evaluation at this point with MRI arthrogram of the left hip to further characterize cause of hip pain.  The arthrogram injection should be helpful as the numbing medicine should help [place] know if it is more intra-articular not in the imaging study which should be helpful as well.  Check back after MRI arthrogram.   PDMP not reviewed this encounter. Orders Placed This Encounter  Procedures  . DG Lumbar Spine 2-3 Views    Standing Status:   Future    Number of Occurrences:   1    Standing Expiration Date:   07/15/2021    Order Specific Question:   Reason for Exam (SYMPTOM  OR DIAGNOSIS REQUIRED)    Answer:   eval left groin pain suspect L1 or L2 rad    Order Specific Question:   Preferred imaging location?    Answer:   [facility]  . MR HIP LEFT W CONTRAST    Arthrogram only no IV contast in IV    Standing Status:   Future    Standing Expiration Date:   07/15/2021    Scheduling Instructions:     Schedule with Dr T 1 hour prior to MRI for injection    Order Specific Question:   If indicated for the ordered procedure, I authorize the administration of contrast media per Radiology protocol    Answer:   Yes    Order Specific Question:   What is the patient's sedation  requirement?    Answer:   No Sedation    Order Specific Question:   Does the patient have a pacemaker or implanted devices?    Answer:   No    Order Specific Question:   Preferred imaging location?    Answer:   Product/process development scientist (table limit-350lbs)   Meds ordered this encounter  Medications  . LORazepam (ATIVAN) 0.5 MG tablet    Sig: 1-2 tabs 30 - 60 min prior to MRI. Do not drive with this medicine.    Dispense:  4 tablet    Refill:  0     Discussed warning signs or symptoms. Please see discharge instructions. Patient expresses understanding.   The above documentation has been reviewed and is accurate and complete Lynne Leader,  M.D.

## 2020-07-15 ENCOUNTER — Ambulatory Visit: Payer: Managed Care, Other (non HMO) | Admitting: Family Medicine

## 2020-07-15 ENCOUNTER — Ambulatory Visit: Payer: Self-pay

## 2020-07-15 ENCOUNTER — Other Ambulatory Visit: Payer: Self-pay

## 2020-07-15 ENCOUNTER — Ambulatory Visit (INDEPENDENT_AMBULATORY_CARE_PROVIDER_SITE_OTHER): Payer: Managed Care, Other (non HMO)

## 2020-07-15 ENCOUNTER — Encounter: Payer: Self-pay | Admitting: Family Medicine

## 2020-07-15 VITALS — BP 150/100 | HR 93 | Ht 72.0 in | Wt 168.8 lb

## 2020-07-15 DIAGNOSIS — R1032 Left lower quadrant pain: Secondary | ICD-10-CM

## 2020-07-15 DIAGNOSIS — M545 Low back pain, unspecified: Secondary | ICD-10-CM

## 2020-07-15 MED ORDER — LORAZEPAM 0.5 MG PO TABS: ORAL_TABLET | ORAL | 0 refills | Status: DC

## 2020-07-15 NOTE — Patient Instructions (Addendum)
Thank you for coming in today.  Plan for MRI.   Ok to take the lorazepam prior to MRI and injection if needed.   Recheck following MRI.   Do not drive after taking lorazepam.   Please get an Xray today before you leave

## 2020-07-17 NOTE — Progress Notes (Signed)
X-ray lumbar spine looks normal to radiology

## 2020-07-29 ENCOUNTER — Ambulatory Visit (INDEPENDENT_AMBULATORY_CARE_PROVIDER_SITE_OTHER): Payer: Managed Care, Other (non HMO) | Admitting: Sports Medicine

## 2020-07-29 ENCOUNTER — Other Ambulatory Visit: Payer: Self-pay

## 2020-07-29 ENCOUNTER — Ambulatory Visit (INDEPENDENT_AMBULATORY_CARE_PROVIDER_SITE_OTHER): Payer: Managed Care, Other (non HMO)

## 2020-07-29 ENCOUNTER — Ambulatory Visit: Payer: Self-pay

## 2020-07-29 DIAGNOSIS — G8929 Other chronic pain: Secondary | ICD-10-CM | POA: Diagnosis not present

## 2020-07-29 DIAGNOSIS — R1032 Left lower quadrant pain: Secondary | ICD-10-CM

## 2020-07-29 DIAGNOSIS — M25552 Pain in left hip: Secondary | ICD-10-CM | POA: Diagnosis not present

## 2020-07-29 DIAGNOSIS — R102 Pelvic and perineal pain: Secondary | ICD-10-CM | POA: Diagnosis not present

## 2020-07-29 MED ORDER — GADOBUTROL 1 MMOL/ML IV SOLN
1.0000 mL | Freq: Once | INTRAVENOUS | Status: AC | PRN
  Administered 2020-07-29: 1 mL via INTRAVENOUS

## 2020-07-29 NOTE — Progress Notes (Signed)
° ° °  Procedures performed today:    Procedure: Real-time Ultrasound Guided gadolinium contrast injection of left hip joint Device: Samsung HS60  Verbal informed consent obtained.  Time-out conducted.  Noted no overlying erythema, induration, or other signs of local infection.  Skin prepped in a sterile fashion.  Local anesthesia: Topical Ethyl chloride.  With sterile technique and under real time ultrasound guidance: Using a 22-gauge spinal needle advanced to the femoral head/neck junction, contacted bone and then injected 1 cc kenalog 40, 2 cc lidocaine, 2 cc bupivacaine, syringe switched and 0.1 cc gadolinium injected, syringe again switched and 10 cc saline used to distend the joint. Joint visualized and capsule seen distending confirming intra-articular placement of contrast material and medication. Completed without difficulty  Advised to call if fevers/chills, erythema, induration, drainage, or persistent bleeding.  Images permanently stored in PACS Impression: Technically successful ultrasound guided gadolinium contrast injection for MR arthrography.  Please see separate MR arthrogram report.   Independent interpretation of notes and tests performed by another provider:   None.  Brief History, Exam, Impression, and Recommendations:    Left hip pain MR arthrogram injection as above, further management per primary treating provider.    ___________________________________________ Gwen Her. Dianah Field, M.D., ABFM., CAQSM. Primary Care and [place] Instructor of [place] [employer] [facility]

## 2020-07-29 NOTE — Assessment & Plan Note (Signed)
MR arthrogram injection as above, further management per primary treating provider.

## 2020-07-30 NOTE — Progress Notes (Signed)
MRI hip shows extensive tearing of the labrum.  This is probably the cause of the hip pain.  Fortunately no stress fracture or other severe findings are present.  Hopefully will get some benefit from the injection that was done as part of the arthrogram.  Schedule follow-up appointment with me to review the findings in full detail in the near future.

## 2020-07-30 NOTE — Progress Notes (Signed)
I, Kenneth Irwin, LAT, ATC, am serving as scribe for Dr. Lynne Irwin.  Kenneth Irwin is a 54 y.o. male who presents to [place] at [facility] today for f/u of L hip/groin pain and L hip MRI review.  He was last seen by Dr. Georgina Irwin on 07/15/20 and noted no change in his L ant groin pain despite having completed a course of PT.  Since his last visit, pt reports soreness yesterday, but feeling pretty good today (since his injection).  As part of the MRI arthrogram he had Kenalog injection.  Diagnostic imaging: L hip MRI w/ contrast- 07/29/20; L-spine XR- 07/15/20; L hip XR- 02/01/20  Pertinent review of systems: No fevers or chills  Relevant historical information: Avid exerciser.  Runs cycles and does the rowing machine.   Exam:  BP 138/78 (BP Location: Right Arm, Patient Position: Sitting, Cuff Size: Normal)   Pulse (!) 110   Ht 6' (1.829 m)   Wt 170 lb 12.8 oz (77.5 kg)   SpO2 97%   BMI 23.16 kg/m  General: Well Developed, well nourished, and in no acute distress.   MSK: Normal gait    Lab and Radiology Results No results found for this or any previous visit (from the past 72 hour(s)). MR HIP LEFT W CONTRAST  Result Date: 07/30/2020 CLINICAL DATA:  Left groin pain for 8 months.  No known injury. EXAM: MRI OF THE LEFT HIP WITH CONTRAST (MR Arthrogram) TECHNIQUE: Multiplanar, multisequence MR imaging of the hip was performed immediately following contrast injection into the hip joint under fluoroscopic guidance. No intravenous contrast was administered. COMPARISON:  None. FINDINGS: Bones: No fracture, stress change or worrisome lesion. No subchondral cyst formation or edema about the hips. No avascular necrosis of the femoral heads. Articular cartilage and labrum Articular cartilage:  Mildly degenerated without focal defect. Labrum: There is extensive tearing of the anterior labrum. The superior labrum is blunted and frayed without focal tear identified. Joint or bursal  effusion Joint effusion: The left hip is distended with contrast. No right effusion. Bursae: Negative. Muscles and tendons Muscles and tendons:  Intact and normal in appearance. Other findings Miscellaneous:   Imaged intrapelvic contents are negative. IMPRESSION: Extensive tearing of the left anterior labrum. The superior labrum is degenerated and blunted without discrete tear identified. Electronically Signed   By: Kenneth Irwin M.D.   On: 07/30/2020 10:47   [place] LIMITED JOINT SPACE STRUCTURES LOW LEFT  Result Date: 07/29/2020 Procedure: Real-time Ultrasound Guided gadolinium contrast injection of left hip joint Device: Samsung HS60 Verbal informed consent obtained. Time-out conducted. Noted no overlying erythema, induration, or other signs of local infection. Skin prepped in a sterile fashion. Local anesthesia: Topical Ethyl chloride. With sterile technique and under real time ultrasound guidance: Using a 22-gauge spinal needle advanced to the femoral head/neck junction, contacted bone and then injected 1 cc kenalog 40, 2 cc lidocaine, 2 cc bupivacaine, syringe switched and 0.1 cc gadolinium injected, syringe again switched and 10 cc saline used to distend the joint. Joint visualized and capsule seen distending confirming intra-articular placement of contrast material and medication. Completed without difficulty Advised to call if fevers/chills, erythema, induration, drainage, or persistent bleeding. Images permanently stored in PACS Impression: Technically successful ultrasound guided gadolinium contrast injection for MR arthrography.  Please see separate MR arthrogram report. I, Kenneth Irwin, personally (independently) visualized and performed the interpretation of the MRI images attached in this note.      Assessment and Plan: 54 y.o. male  with left anterior hip pain due to significant labrum tear.  Fortunately does not have significant arthritis changes of the hip.  Given his overall good health,  absence of significant arthritis and active lifestyle I think it is worthwhile to have at least a consultation with orthopedic surgeon to discuss possibility of hip scope.  He may actually be a good candidate for labrum debridement or other more minimally invasive surgical interventions than hip replacement.  He has had a pretty good response to the steroid component of the hip injection with his arthrogram.  Hopefully he will have some lasting benefit from it although I am not very optimistic. Discussed activity modification.  Recheck as needed.   PDMP not reviewed this encounter. Orders Placed This Encounter  Procedures  . Ambulatory referral to Orthopedic Surgery    Referral Priority:   Routine    Referral Type:   Surgical    Referral Reason:   Specialty Services Required    Referred to Provider:   Grace Blight, MD    Requested Specialty:   Orthopedic Surgery    Number of Visits Requested:   1   No orders of the defined types were placed in this encounter.    Discussed warning signs or symptoms. Please see discharge instructions. Patient expresses understanding.   The above documentation has been reviewed and is accurate and complete Kenneth Irwin, M.D.

## 2020-07-31 ENCOUNTER — Other Ambulatory Visit: Payer: Self-pay

## 2020-07-31 ENCOUNTER — Ambulatory Visit: Payer: Managed Care, Other (non HMO) | Admitting: Family Medicine

## 2020-07-31 VITALS — BP 138/78 | HR 110 | Ht 72.0 in | Wt 170.8 lb

## 2020-07-31 DIAGNOSIS — R1032 Left lower quadrant pain: Secondary | ICD-10-CM

## 2020-07-31 DIAGNOSIS — S73192A Other sprain of left hip, initial encounter: Secondary | ICD-10-CM

## 2020-07-31 NOTE — Patient Instructions (Signed)
Thank you for coming in today.  Referral to Dr Aretha Parrot at [facility].   Call [place] and get a CD made of you MRI and xray images.   Ok to advance activity as tolerated. Cycling may be the worst.

## 2021-02-18 NOTE — Progress Notes (Signed)
I, Wendy Poet, LAT, ATC, am serving as scribe for Dr. Lynne Leader.  Kenneth Irwin is a 55 y.o. male who presents to [facility] today for  f/u of L hip/groin pain due to a labrum tear. Pt was last seen by Dr. Georgina Snell on 07/31/20 for this condition and was referred to Dr. Aretha Parrot, however never scheduled a visit. Today, pt reports that his L groin pain has started to return and begins in his L testicle that then radiates to his lower back and L lower abdomen.  He states that it's not as bad as previously in that he is still able to run, running 3 miles daily.  He denies any thigh pain or any L hip mechanical symptoms.  He had great response to the steroid component of the left hip arthrogram injection back in November.  He would like to have a repeat steroid injection today if possible.  Dx imaging: 07/29/20 L hip MR Arthrogram   07/15/20 L-spine XR  02/01/20 L hip XR  Pertinent review of systems: No fevers or chills  Relevant historical information: Hemorrhoids   Exam:  BP 128/78 (BP Location: Right Arm, Patient Position: Sitting, Cuff Size: Normal)   Pulse 83   Ht 6' (1.829 m)   Wt 169 lb (76.7 kg)   SpO2 98%   BMI 22.92 kg/m  General: Well Developed, well nourished, and in no acute distress.   MSK: Left hip normal motion.    Lab and Radiology Results  Procedure: Real-time Ultrasound Guided Injection of left hip femoral acetabular joint Device: Philips Affiniti 50G Images permanently stored and available for review in PACS Verbal informed consent obtained.  Discussed risks and benefits of procedure. Warned about infection bleeding damage to structures skin hypopigmentation and fat atrophy among others. Patient expresses understanding and agreement Time-out conducted.   Noted no overlying erythema, induration, or other signs of local infection.   Skin prepped in a sterile fashion.   Local anesthesia: Topical Ethyl chloride.   With sterile  technique and under real time ultrasound guidance: 40 mg of Kenalog and 2 mL of Marcaine injected into the hip joint. Fluid seen entering the joint capsule.   Completed without difficulty   Pain immediately resolved suggesting accurate placement of the medication.   Advised to call if fevers/chills, erythema, induration, drainage, or persistent bleeding.   Images permanently stored and available for review in the ultrasound unit.  Impression: Technically successful ultrasound guided injection.       Assessment and Plan: 55 y.o. male with left groin pain thought to be due to left hip labrum tear seen on MRI arthrogram November.  Patient did well with steroid injection since November 2021.  For repeat injections today as he had good response previously.  Of note his pain pattern is a bit odd with pain more into the groin/testicle and into the back.  If this injection does not help would consider work-up for potential L1 lumbar radiculopathy with MRI L-spine.   PDMP not reviewed this encounter. Orders Placed This Encounter  Procedures   [place] LIMITED JOINT SPACE STRUCTURES LOW LEFT(NO LINKED CHARGES)    Order Specific Question:   Reason for Exam (SYMPTOM  OR DIAGNOSIS REQUIRED)    Answer:   L hip pain    Order Specific Question:   Preferred imaging location?    Answer:   [place]   No orders of the defined types were placed in this encounter.  Discussed warning signs or symptoms. Please see discharge instructions. Patient expresses understanding.   The above documentation has been reviewed and is accurate and complete Lynne Leader, M.D.

## 2021-02-19 ENCOUNTER — Other Ambulatory Visit: Payer: Self-pay

## 2021-02-19 ENCOUNTER — Ambulatory Visit: Payer: Managed Care, Other (non HMO) | Admitting: Family Medicine

## 2021-02-19 ENCOUNTER — Encounter: Payer: Self-pay | Admitting: Family Medicine

## 2021-02-19 ENCOUNTER — Ambulatory Visit: Payer: Self-pay

## 2021-02-19 VITALS — BP 128/78 | HR 83 | Ht 72.0 in | Wt 169.0 lb

## 2021-02-19 DIAGNOSIS — S73192D Other sprain of left hip, subsequent encounter: Secondary | ICD-10-CM

## 2021-02-19 DIAGNOSIS — M25552 Pain in left hip: Secondary | ICD-10-CM

## 2021-02-19 DIAGNOSIS — R1032 Left lower quadrant pain: Secondary | ICD-10-CM

## 2021-02-19 DIAGNOSIS — S73192A Other sprain of left hip, initial encounter: Secondary | ICD-10-CM | POA: Insufficient documentation

## 2021-02-19 NOTE — Patient Instructions (Addendum)
Good to see you today.  You had a L hip injection today.  Call or go to the ER if you develop a large red swollen joint with extreme pain or oozing puss.   See how you feel while it numb for a few hours and then later this week.   If not better let me know.

## 2022-03-28 IMAGING — MR MR HIP*L* W/CM
6 series · 40 of 40 positions shown · IV contrast (agent unspecified)
Comparison: None.

CLINICAL DATA: Left groin pain for 8 months.  No known injury.

EXAM:
MRI OF THE LEFT HIP WITH CONTRAST (MR Arthrogram)
TECHNIQUE: Multiplanar, multisequence MR imaging of the hip was performed
immediately following contrast injection into the hip joint under
fluoroscopic guidance. No intravenous contrast was administered.

[Series 5: T2 fat-sat · coronal · 4.0mm · 1.33mm/px · 9 of 41 slices shown]
[im 1/41]
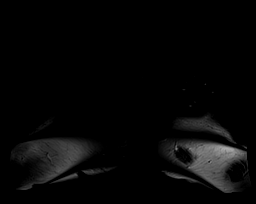
[im 6/41]
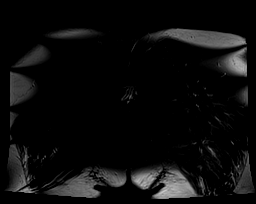
[im 11/41]
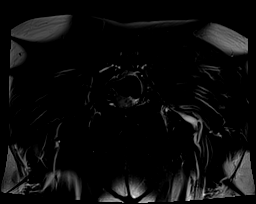
[im 16/41]
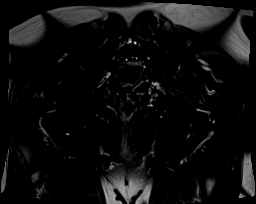
[im 21/41]
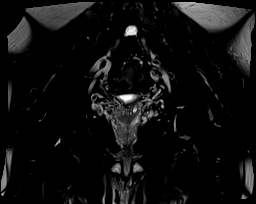
[im 26/41]
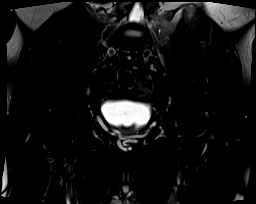
[im 31/41]
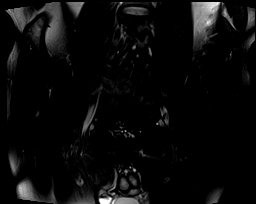
[im 36/41]
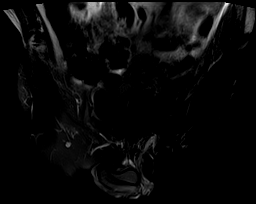
[im 41/41]
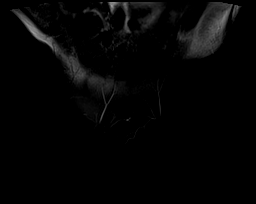

[Series 6: STIR · coronal · 4.0mm · 1.33mm/px · 8 of 41 slices shown]
[im 1/41]
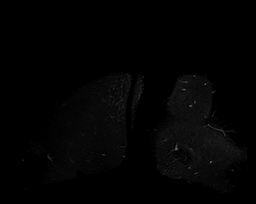
[im 6/41]
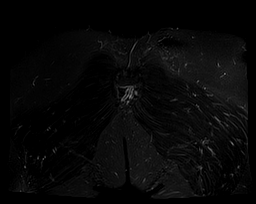
[im 12/41]
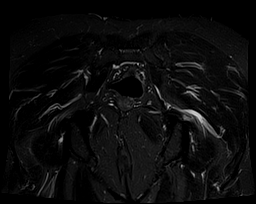
[im 18/41]
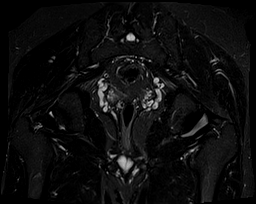
[im 23/41]
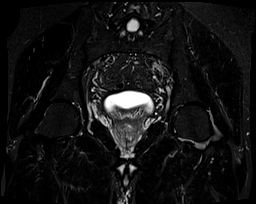
[im 29/41]
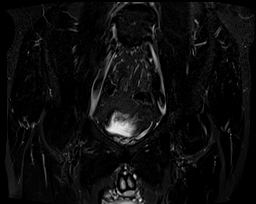
[im 35/41]
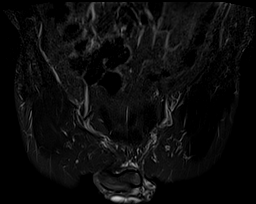
[im 41/41]
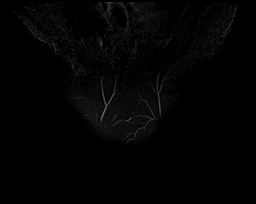

[Series 7: T1 · coronal · 4.0mm · 1.33mm/px · 8 of 41 slices shown]
[im 1/41]
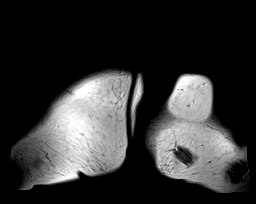
[im 6/41]
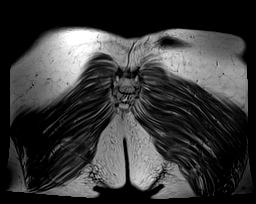
[im 12/41]
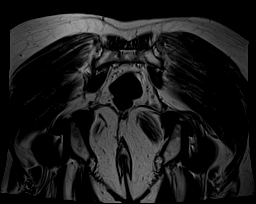
[im 18/41]
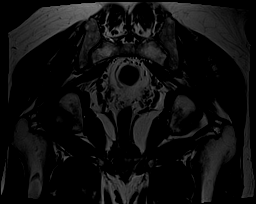
[im 23/41]
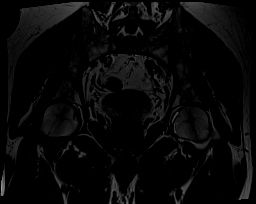
[im 29/41]
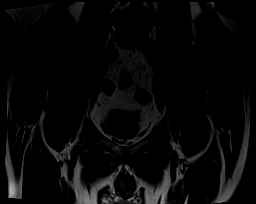
[im 35/41]
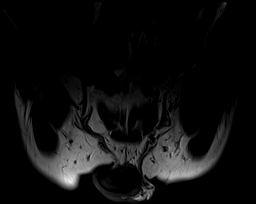
[im 41/41]
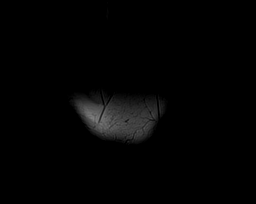

[Series 10: T1 fat-sat · coronal · 4.0mm · 0.70mm/px · 5 of 22 slices shown (1 of 3)]
[im 1/22]
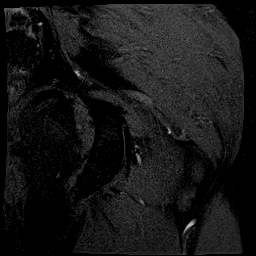
[im 6/22]
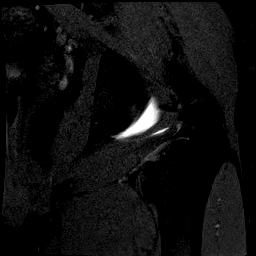
[im 11/22]
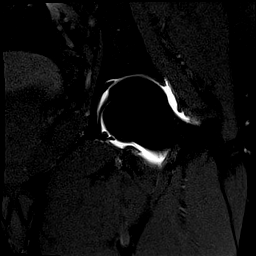
[im 16/22]
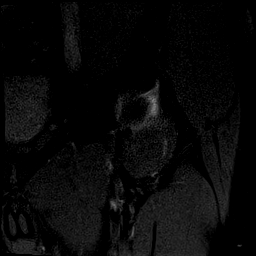
[im 22/22]
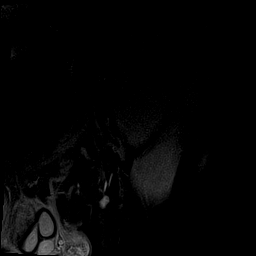

[Series 11: T1 fat-sat · sagittal · 4.0mm · 0.70mm/px · 6 of 28 slices shown (2 of 3)]
[im 1/28]
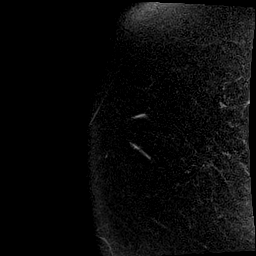
[im 6/28]
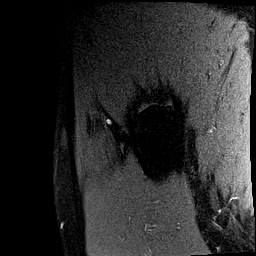
[im 11/28]
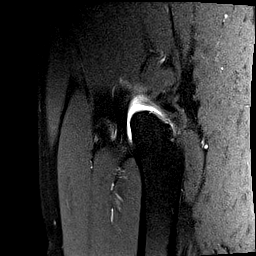
[im 17/28]
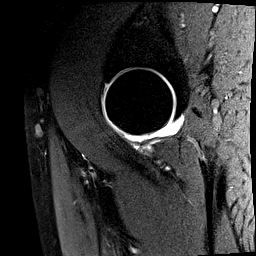
[im 22/28]
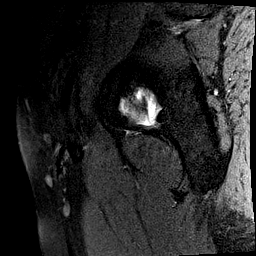
[im 28/28]
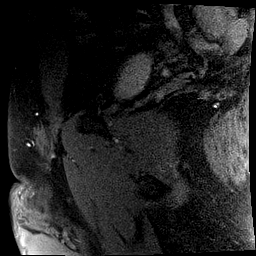

[Series 12: T1 fat-sat · oblique · 4.0mm · 0.70mm/px · 4 of 20 slices shown (3 of 3)]
[im 1/20]
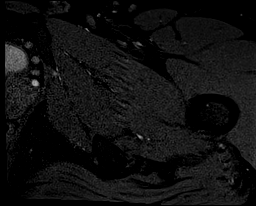
[im 7/20]
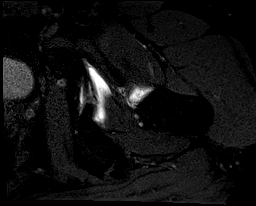
[im 13/20]
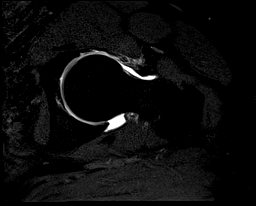
[im 20/20]
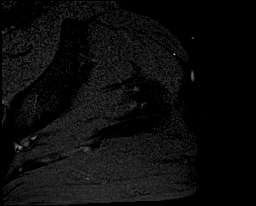

[40 of 40 positions shown; findings below may reference images not displayed]

FINDINGS: Bones: No fracture, stress change or worrisome lesion. No
subchondral cyst formation or edema about the hips. No avascular
necrosis of the femoral heads.

Articular cartilage and labrum

Articular cartilage:  Mildly degenerated without focal defect.

Labrum: There is extensive tearing of the anterior labrum. The
superior labrum is blunted and frayed without focal tear identified.

Joint or bursal effusion

Joint effusion: The left hip is distended with contrast. No right
effusion.

Bursae: Negative.

Muscles and tendons

Muscles and tendons:  Intact and normal in appearance.

Other findings

Miscellaneous:   Imaged intrapelvic contents are negative.
IMPRESSION: Extensive tearing of the left anterior labrum. The superior labrum
is degenerated and blunted without discrete tear identified.

## 2023-01-13 ENCOUNTER — Ambulatory Visit
Admission: RE | Admit: 2023-01-13 | Discharge: 2023-01-13 | Disposition: A | Discharge status: Home / Self Care | Payer: Managed Care, Other (non HMO) | Source: Ambulatory Visit | Attending: Sports Medicine | Admitting: Sports Medicine

## 2023-01-13 ENCOUNTER — Other Ambulatory Visit: Payer: Self-pay | Admitting: Sports Medicine

## 2023-01-13 DIAGNOSIS — M25571 Pain in right ankle and joints of right foot: Secondary | ICD-10-CM

## 2024-03-28 ENCOUNTER — Other Ambulatory Visit: Payer: Self-pay | Admitting: Sports Medicine

## 2024-03-28 ENCOUNTER — Ambulatory Visit
Admission: RE | Admit: 2024-03-28 | Discharge: 2024-03-28 | Disposition: A | Discharge status: Home / Self Care | Source: Ambulatory Visit | Attending: Sports Medicine | Admitting: Sports Medicine

## 2024-03-28 DIAGNOSIS — M25512 Pain in left shoulder: Secondary | ICD-10-CM

## 2024-03-30 ENCOUNTER — Other Ambulatory Visit: Payer: Self-pay | Admitting: Sports Medicine

## 2024-03-30 DIAGNOSIS — M25512 Pain in left shoulder: Secondary | ICD-10-CM

## 2024-03-30 DIAGNOSIS — M7542 Impingement syndrome of left shoulder: Secondary | ICD-10-CM

## 2024-04-17 ENCOUNTER — Other Ambulatory Visit

## 2024-04-25 ENCOUNTER — Inpatient Hospital Stay
Admission: RE | Admit: 2024-04-25 | Discharge: 2024-04-25 | Disposition: A | Discharge status: Home / Self Care | Source: Ambulatory Visit | Attending: Sports Medicine | Admitting: Sports Medicine

## 2024-04-25 ENCOUNTER — Ambulatory Visit
Admission: RE | Admit: 2024-04-25 | Discharge: 2024-04-25 | Disposition: A | Discharge status: Home / Self Care | Source: Ambulatory Visit | Attending: Sports Medicine | Admitting: Sports Medicine

## 2024-04-25 DIAGNOSIS — M25512 Pain in left shoulder: Secondary | ICD-10-CM

## 2024-04-25 DIAGNOSIS — M7542 Impingement syndrome of left shoulder: Secondary | ICD-10-CM

## 2024-04-25 MED ORDER — IOPAMIDOL (ISOVUE-M 200) INJECTION 41%
10.0000 mL | Freq: Once | INTRAMUSCULAR | Status: AC
  Administered 2024-04-25: 10 mL via INTRA_ARTICULAR
